# Patient Record
Sex: Female | Born: 1985 | Race: White | Hispanic: No | Smoking: Never smoker
Health system: Southern US, Community
[De-identification: ages and names within clinical notes are randomized; demographics above are authoritative.]

## PROBLEM LIST (undated history)

## (undated) HISTORY — PX: TUBAL LIGATION: SHX77

---

## 2020-04-13 ENCOUNTER — Emergency Department (HOSPITAL_COMMUNITY): Payer: No Typology Code available for payment source

## 2020-04-13 ENCOUNTER — Other Ambulatory Visit: Payer: Self-pay

## 2020-04-13 ENCOUNTER — Inpatient Hospital Stay (HOSPITAL_COMMUNITY)
Admission: EM | Admit: 2020-04-13 | Discharge: 2020-04-15 | DRG: 419 | Disposition: A | Discharge status: Home / Self Care | Payer: No Typology Code available for payment source | Attending: Family Medicine | Admitting: Family Medicine

## 2020-04-13 ENCOUNTER — Encounter (HOSPITAL_COMMUNITY): Payer: Self-pay | Admitting: Obstetrics and Gynecology

## 2020-04-13 DIAGNOSIS — Z800 Family history of malignant neoplasm of digestive organs: Secondary | ICD-10-CM

## 2020-04-13 DIAGNOSIS — R1013 Epigastric pain: Secondary | ICD-10-CM | POA: Diagnosis not present

## 2020-04-13 DIAGNOSIS — R112 Nausea with vomiting, unspecified: Secondary | ICD-10-CM

## 2020-04-13 DIAGNOSIS — K5900 Constipation, unspecified: Secondary | ICD-10-CM | POA: Diagnosis present

## 2020-04-13 DIAGNOSIS — K810 Acute cholecystitis: Secondary | ICD-10-CM

## 2020-04-13 DIAGNOSIS — Z20822 Contact with and (suspected) exposure to covid-19: Secondary | ICD-10-CM | POA: Diagnosis present

## 2020-04-13 DIAGNOSIS — K8001 Calculus of gallbladder with acute cholecystitis with obstruction: Secondary | ICD-10-CM | POA: Diagnosis not present

## 2020-04-13 DIAGNOSIS — K760 Fatty (change of) liver, not elsewhere classified: Secondary | ICD-10-CM | POA: Diagnosis present

## 2020-04-13 LAB — COMPREHENSIVE METABOLIC PANEL
ALT: 30 U/L (ref 0–44)
AST: 25 U/L (ref 15–41)
Albumin: 4.7 g/dL (ref 3.5–5.0)
Alkaline Phosphatase: 52 U/L (ref 38–126)
Anion gap: 10 (ref 5–15)
BUN: 10 mg/dL (ref 6–20)
CO2: 23 mmol/L (ref 22–32)
Calcium: 9.3 mg/dL (ref 8.9–10.3)
Chloride: 100 mmol/L (ref 98–111)
Creatinine, Ser: 0.77 mg/dL (ref 0.44–1.00)
GFR, Estimated: >60 mL/min (ref >60)
Glucose, Bld: 104 mg/dL — ABNORMAL HIGH (ref 70–99)
Potassium: 4.1 mmol/L (ref 3.5–5.1)
Sodium: 133 mmol/L — ABNORMAL LOW (ref 135–145)
Total Bilirubin: 0.7 mg/dL (ref 0.3–1.2)
Total Protein: 8.7 g/dL — ABNORMAL HIGH (ref 6.5–8.1)

## 2020-04-13 LAB — LIPASE, BLOOD: Lipase: 28 U/L (ref 11–51)

## 2020-04-13 LAB — CBC
HCT: 42.3 % (ref 36.0–46.0)
Hemoglobin: 13.9 g/dL (ref 12.0–15.0)
MCH: 29.1 pg (ref 26.0–34.0)
MCHC: 32.9 g/dL (ref 30.0–36.0)
MCV: 88.5 fL (ref 80.0–100.0)
Platelets: 314 10*3/uL (ref 150–400)
RBC: 4.78 MIL/uL (ref 3.87–5.11)
RDW: 14.1 % (ref 11.5–15.5)
WBC: 12 10*3/uL — ABNORMAL HIGH (ref 4.0–10.5)
nRBC: 0 % (ref 0.0–0.2)

## 2020-04-13 LAB — URINALYSIS, ROUTINE W REFLEX MICROSCOPIC
Bacteria, UA: NONE SEEN
Bilirubin Urine: NEGATIVE
Glucose, UA: NEGATIVE mg/dL
Ketones, ur: 5 mg/dL — AB
Leukocytes,Ua: NEGATIVE
Nitrite: NEGATIVE
Protein, ur: NEGATIVE mg/dL
Specific Gravity, Urine: 1.006 (ref 1.005–1.030)
pH: 6 (ref 5.0–8.0)

## 2020-04-13 LAB — I-STAT BETA HCG BLOOD, ED (MC, WL, AP ONLY): I-stat hCG, quantitative: <5 m[IU]/mL (ref <5)

## 2020-04-13 LAB — LACTIC ACID, PLASMA: Lactic Acid, Venous: 0.8 mmol/L (ref 0.5–1.9)

## 2020-04-13 MED ORDER — MORPHINE SULFATE (PF) 4 MG/ML IV SOLN
4.0000 mg | Freq: Once | INTRAVENOUS | Status: AC
Start: 2020-04-13 — End: 2020-04-13
  Administered 2020-04-13: 4 mg via INTRAVENOUS
  Filled 2020-04-13: qty 1

## 2020-04-13 MED ORDER — ACETAMINOPHEN 325 MG PO TABS
650.0000 mg | ORAL_TABLET | Freq: Four times a day (QID) | ORAL | Status: DC | PRN
  Administered 2020-04-14: 650 mg via ORAL
  Filled 2020-04-13: qty 2

## 2020-04-13 MED ORDER — SODIUM CHLORIDE 0.9 % IV BOLUS
1000.0000 mL | Freq: Once | INTRAVENOUS | Status: AC
  Administered 2020-04-13: 1000 mL via INTRAVENOUS

## 2020-04-13 MED ORDER — OXYCODONE HCL 5 MG PO TABS: 5.0000 mg | ORAL_TABLET | ORAL | Status: DC | PRN

## 2020-04-13 MED ORDER — SODIUM CHLORIDE 0.9 % IV SOLN
2.0000 g | Freq: Once | INTRAVENOUS | Status: AC
  Administered 2020-04-13: 2 g via INTRAVENOUS
  Filled 2020-04-13: qty 20

## 2020-04-13 MED ORDER — PANTOPRAZOLE SODIUM 40 MG IV SOLR
40.0000 mg | Freq: Two times a day (BID) | INTRAVENOUS | Status: DC
  Administered 2020-04-13 – 2020-04-14 (×3): 40 mg via INTRAVENOUS
  Filled 2020-04-13 (×3): qty 40

## 2020-04-13 MED ORDER — PROMETHAZINE HCL 25 MG/ML IJ SOLN
12.5000 mg | Freq: Once | INTRAMUSCULAR | Status: AC
  Administered 2020-04-13: 12.5 mg via INTRAVENOUS
  Filled 2020-04-13: qty 1

## 2020-04-13 MED ORDER — SODIUM CHLORIDE 0.9% FLUSH
3.0000 mL | Freq: Two times a day (BID) | INTRAVENOUS | Status: DC
  Administered 2020-04-13 – 2020-04-15 (×3): 3 mL via INTRAVENOUS

## 2020-04-13 MED ORDER — HYDROMORPHONE HCL 1 MG/ML IJ SOLN
0.5000 mg | INTRAMUSCULAR | Status: DC | PRN
  Administered 2020-04-13 – 2020-04-15 (×6): 1 mg via INTRAVENOUS
  Filled 2020-04-13 (×7): qty 1

## 2020-04-13 MED ORDER — ONDANSETRON HCL 4 MG/2ML IJ SOLN
4.0000 mg | Freq: Four times a day (QID) | INTRAMUSCULAR | Status: DC | PRN
  Administered 2020-04-14 (×2): 4 mg via INTRAVENOUS
  Filled 2020-04-13 (×2): qty 2

## 2020-04-13 MED ORDER — LIDOCAINE VISCOUS HCL 2 % MT SOLN
15.0000 mL | Freq: Once | OROMUCOSAL | Status: AC
  Administered 2020-04-13: 15 mL via ORAL
  Filled 2020-04-13: qty 15

## 2020-04-13 MED ORDER — ACETAMINOPHEN 650 MG RE SUPP: 650.0000 mg | Freq: Four times a day (QID) | RECTAL | Status: DC | PRN

## 2020-04-13 MED ORDER — OXYCODONE-ACETAMINOPHEN 5-325 MG PO TABS
1.0000 | ORAL_TABLET | Freq: Once | ORAL | Status: AC
  Administered 2020-04-13: 1 via ORAL
  Filled 2020-04-13: qty 1

## 2020-04-13 MED ORDER — ONDANSETRON HCL 4 MG/2ML IJ SOLN
4.0000 mg | Freq: Once | INTRAMUSCULAR | Status: AC
  Administered 2020-04-13: 4 mg via INTRAVENOUS
  Filled 2020-04-13: qty 2

## 2020-04-13 MED ORDER — SODIUM CHLORIDE 0.9 % IV SOLN: INTRAVENOUS | Status: DC

## 2020-04-13 MED ORDER — SODIUM CHLORIDE 0.9 % IV SOLN
2.0000 g | INTRAVENOUS | Status: DC
  Administered 2020-04-14: 09:00:00 2 g via INTRAVENOUS
  Filled 2020-04-13: qty 2

## 2020-04-13 MED ORDER — IOHEXOL 300 MG/ML  SOLN
100.0000 mL | Freq: Once | INTRAMUSCULAR | Status: AC | PRN
  Administered 2020-04-13: 100 mL via INTRAVENOUS

## 2020-04-13 MED ORDER — ALUM & MAG HYDROXIDE-SIMETH 200-200-20 MG/5ML PO SUSP
30.0000 mL | Freq: Once | ORAL | Status: AC
  Administered 2020-04-13: 30 mL via ORAL
  Filled 2020-04-13: qty 30

## 2020-04-13 MED ORDER — ONDANSETRON HCL 4 MG PO TABS: 4.0000 mg | ORAL_TABLET | Freq: Four times a day (QID) | ORAL | Status: DC | PRN

## 2020-04-13 NOTE — ED Triage Notes (Signed)
Patient reports to the ER for abdominal pain. Patient reports she has had episodes like this before but has treated them at home with warm baths. Patient reports the pain is periumbilical that radiates down.
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
## 2020-04-13 NOTE — ED Notes (Addendum)
Patient tolerated PO challenge without vomiting but still reports pain at this time.
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
## 2020-04-14 ENCOUNTER — Encounter (HOSPITAL_COMMUNITY): Admission: EM | Disposition: A | Discharge status: Home / Self Care | Payer: Self-pay | Source: Home / Self Care

## 2020-04-14 ENCOUNTER — Observation Stay (HOSPITAL_COMMUNITY): Payer: No Typology Code available for payment source | Admitting: Anesthesiology

## 2020-04-14 ENCOUNTER — Observation Stay (HOSPITAL_COMMUNITY): Payer: No Typology Code available for payment source

## 2020-04-14 ENCOUNTER — Encounter (HOSPITAL_COMMUNITY): Payer: Self-pay | Admitting: Family Medicine

## 2020-04-14 DIAGNOSIS — K810 Acute cholecystitis: Secondary | ICD-10-CM | POA: Diagnosis not present

## 2020-04-14 DIAGNOSIS — K5900 Constipation, unspecified: Secondary | ICD-10-CM | POA: Diagnosis present

## 2020-04-14 DIAGNOSIS — K760 Fatty (change of) liver, not elsewhere classified: Secondary | ICD-10-CM | POA: Diagnosis present

## 2020-04-14 DIAGNOSIS — Z800 Family history of malignant neoplasm of digestive organs: Secondary | ICD-10-CM | POA: Diagnosis not present

## 2020-04-14 DIAGNOSIS — K8001 Calculus of gallbladder with acute cholecystitis with obstruction: Secondary | ICD-10-CM | POA: Diagnosis present

## 2020-04-14 DIAGNOSIS — R1013 Epigastric pain: Secondary | ICD-10-CM | POA: Diagnosis present

## 2020-04-14 DIAGNOSIS — Z20822 Contact with and (suspected) exposure to covid-19: Secondary | ICD-10-CM | POA: Diagnosis present

## 2020-04-14 HISTORY — PX: CHOLECYSTECTOMY: SHX55

## 2020-04-14 LAB — COMPREHENSIVE METABOLIC PANEL
ALT: 24 U/L (ref 0–44)
AST: 17 U/L (ref 15–41)
Albumin: 3.7 g/dL (ref 3.5–5.0)
Alkaline Phosphatase: 49 U/L (ref 38–126)
Anion gap: 11 (ref 5–15)
BUN: 8 mg/dL (ref 6–20)
CO2: 20 mmol/L — ABNORMAL LOW (ref 22–32)
Calcium: 8.2 mg/dL — ABNORMAL LOW (ref 8.9–10.3)
Chloride: 105 mmol/L (ref 98–111)
Creatinine, Ser: 0.59 mg/dL (ref 0.44–1.00)
GFR, Estimated: >60 mL/min (ref >60)
Glucose, Bld: 102 mg/dL — ABNORMAL HIGH (ref 70–99)
Potassium: 3.8 mmol/L (ref 3.5–5.1)
Sodium: 136 mmol/L (ref 135–145)
Total Bilirubin: 1 mg/dL (ref 0.3–1.2)
Total Protein: 7 g/dL (ref 6.5–8.1)

## 2020-04-14 LAB — LIPASE, BLOOD: Lipase: 27 U/L (ref 11–51)

## 2020-04-14 LAB — CBC
HCT: 39.1 % (ref 36.0–46.0)
Hemoglobin: 12.6 g/dL (ref 12.0–15.0)
MCH: 28.8 pg (ref 26.0–34.0)
MCHC: 32.2 g/dL (ref 30.0–36.0)
MCV: 89.5 fL (ref 80.0–100.0)
Platelets: 223 10*3/uL (ref 150–400)
RBC: 4.37 MIL/uL (ref 3.87–5.11)
RDW: 14.2 % (ref 11.5–15.5)
WBC: 11.3 10*3/uL — ABNORMAL HIGH (ref 4.0–10.5)
nRBC: 0 % (ref 0.0–0.2)

## 2020-04-14 LAB — SARS CORONAVIRUS 2 (TAT 6-24 HRS): SARS Coronavirus 2: NEGATIVE

## 2020-04-14 LAB — HIV ANTIBODY (ROUTINE TESTING W REFLEX): HIV Screen 4th Generation wRfx: NONREACTIVE

## 2020-04-14 SURGERY — LAPAROSCOPIC CHOLECYSTECTOMY WITH INTRAOPERATIVE CHOLANGIOGRAM
Anesthesia: General

## 2020-04-14 MED ORDER — IBUPROFEN 400 MG PO TABS: 600.0000 mg | ORAL_TABLET | Freq: Four times a day (QID) | ORAL | Status: DC | PRN

## 2020-04-14 MED ORDER — DEXAMETHASONE SODIUM PHOSPHATE 10 MG/ML IJ SOLN
INTRAMUSCULAR | Status: AC
  Filled 2020-04-14: qty 2

## 2020-04-14 MED ORDER — PROPOFOL 10 MG/ML IV BOLUS
INTRAVENOUS | Status: DC | PRN
  Administered 2020-04-14: 160 mg via INTRAVENOUS

## 2020-04-14 MED ORDER — MORPHINE SULFATE (PF) 4 MG/ML IV SOLN
3.0000 mg | Freq: Once | INTRAVENOUS | Status: DC
  Filled 2020-04-14: qty 1

## 2020-04-14 MED ORDER — LACTATED RINGERS IV SOLN: INTRAVENOUS | Status: DC

## 2020-04-14 MED ORDER — MIDAZOLAM HCL 2 MG/2ML IJ SOLN
INTRAMUSCULAR | Status: AC
  Filled 2020-04-14: qty 2

## 2020-04-14 MED ORDER — MIDAZOLAM HCL 5 MG/5ML IJ SOLN
INTRAMUSCULAR | Status: DC | PRN
  Administered 2020-04-14: 2 mg via INTRAVENOUS

## 2020-04-14 MED ORDER — TRAMADOL HCL 50 MG PO TABS
50.0000 mg | ORAL_TABLET | Freq: Four times a day (QID) | ORAL | Status: DC | PRN
  Administered 2020-04-14: 50 mg via ORAL
  Filled 2020-04-14: qty 1

## 2020-04-14 MED ORDER — SUGAMMADEX SODIUM 200 MG/2ML IV SOLN
INTRAVENOUS | Status: DC | PRN
  Administered 2020-04-14: 200 mg via INTRAVENOUS

## 2020-04-14 MED ORDER — 0.9 % SODIUM CHLORIDE (POUR BTL) OPTIME
TOPICAL | Status: DC | PRN
  Administered 2020-04-14: 1000 mL

## 2020-04-14 MED ORDER — DIPHENHYDRAMINE HCL 50 MG/ML IJ SOLN
INTRAMUSCULAR | Status: DC | PRN
  Administered 2020-04-14: 12.5 mg via INTRAVENOUS

## 2020-04-14 MED ORDER — LIDOCAINE 2% (20 MG/ML) 5 ML SYRINGE
INTRAMUSCULAR | Status: DC | PRN
  Administered 2020-04-14: 100 mg via INTRAVENOUS

## 2020-04-14 MED ORDER — DEXAMETHASONE SODIUM PHOSPHATE 10 MG/ML IJ SOLN
INTRAMUSCULAR | Status: DC | PRN
  Administered 2020-04-14: 10 mg via INTRAVENOUS

## 2020-04-14 MED ORDER — MEPERIDINE HCL 50 MG/ML IJ SOLN: 6.2500 mg | INTRAMUSCULAR | Status: DC | PRN

## 2020-04-14 MED ORDER — ONDANSETRON HCL 4 MG/2ML IJ SOLN
INTRAMUSCULAR | Status: DC | PRN
  Administered 2020-04-14: 4 mg via INTRAVENOUS

## 2020-04-14 MED ORDER — ONDANSETRON HCL 4 MG/2ML IJ SOLN
INTRAMUSCULAR | Status: AC
  Filled 2020-04-14: qty 4

## 2020-04-14 MED ORDER — HYDROMORPHONE HCL 1 MG/ML IJ SOLN
0.2500 mg | INTRAMUSCULAR | Status: DC | PRN
Start: 2020-04-14 — End: 2020-04-14
  Administered 2020-04-14: 0.5 mg via INTRAVENOUS

## 2020-04-14 MED ORDER — DEXAMETHASONE SODIUM PHOSPHATE 10 MG/ML IJ SOLN
INTRAMUSCULAR | Status: AC
  Filled 2020-04-14: qty 1

## 2020-04-14 MED ORDER — LACTATED RINGERS IV SOLN
INTRAVENOUS | Status: AC | PRN
  Administered 2020-04-14: 1000 mL

## 2020-04-14 MED ORDER — FENTANYL CITRATE (PF) 250 MCG/5ML IJ SOLN
INTRAMUSCULAR | Status: AC
  Filled 2020-04-14: qty 5

## 2020-04-14 MED ORDER — AMISULPRIDE (ANTIEMETIC) 5 MG/2ML IV SOLN: 10.0000 mg | Freq: Once | INTRAVENOUS | Status: DC | PRN

## 2020-04-14 MED ORDER — TECHNETIUM TC 99M MEBROFENIN IV KIT
4.8000 | PACK | Freq: Once | INTRAVENOUS | Status: AC
  Administered 2020-04-14: 11:00:00 4.8 via INTRAVENOUS

## 2020-04-14 MED ORDER — SUCCINYLCHOLINE CHLORIDE 200 MG/10ML IV SOSY
PREFILLED_SYRINGE | INTRAVENOUS | Status: DC | PRN
  Administered 2020-04-14: 120 mg via INTRAVENOUS

## 2020-04-14 MED ORDER — ROCURONIUM BROMIDE 10 MG/ML (PF) SYRINGE
PREFILLED_SYRINGE | INTRAVENOUS | Status: DC | PRN
  Administered 2020-04-14: 10 mg via INTRAVENOUS
  Administered 2020-04-14: 50 mg via INTRAVENOUS

## 2020-04-14 MED ORDER — GLYCOPYRROLATE PF 0.2 MG/ML IJ SOSY
PREFILLED_SYRINGE | INTRAMUSCULAR | Status: AC
  Filled 2020-04-14: qty 1

## 2020-04-14 MED ORDER — ROCURONIUM BROMIDE 10 MG/ML (PF) SYRINGE
PREFILLED_SYRINGE | INTRAVENOUS | Status: AC
  Filled 2020-04-14: qty 10

## 2020-04-14 MED ORDER — FENTANYL CITRATE (PF) 100 MCG/2ML IJ SOLN
INTRAMUSCULAR | Status: DC | PRN
  Administered 2020-04-14: 100 ug via INTRAVENOUS
  Administered 2020-04-14 (×2): 25 ug via INTRAVENOUS
  Administered 2020-04-14: 100 ug via INTRAVENOUS

## 2020-04-14 MED ORDER — PROPOFOL 10 MG/ML IV BOLUS
INTRAVENOUS | Status: AC
  Filled 2020-04-14: qty 20

## 2020-04-14 MED ORDER — SUCCINYLCHOLINE CHLORIDE 200 MG/10ML IV SOSY
PREFILLED_SYRINGE | INTRAVENOUS | Status: AC
  Filled 2020-04-14: qty 10

## 2020-04-14 MED ORDER — HYDROMORPHONE HCL 1 MG/ML IJ SOLN
INTRAMUSCULAR | Status: AC
  Administered 2020-04-14: 0.5 mg via INTRAVENOUS
  Filled 2020-04-14: qty 1

## 2020-04-14 MED ORDER — BUPIVACAINE-EPINEPHRINE (PF) 0.25% -1:200000 IJ SOLN
INTRAMUSCULAR | Status: AC
  Filled 2020-04-14: qty 30

## 2020-04-14 MED ORDER — DIPHENHYDRAMINE HCL 50 MG/ML IJ SOLN
INTRAMUSCULAR | Status: AC
  Filled 2020-04-14: qty 1

## 2020-04-14 MED ORDER — LIDOCAINE HCL (PF) 2 % IJ SOLN
INTRAMUSCULAR | Status: AC
  Filled 2020-04-14: qty 5

## 2020-04-14 MED ORDER — CHLORHEXIDINE GLUCONATE 0.12 % MT SOLN
15.0000 mL | Freq: Once | OROMUCOSAL | Status: AC
  Administered 2020-04-14: 15 mL via OROMUCOSAL

## 2020-04-14 MED ORDER — BUPIVACAINE-EPINEPHRINE 0.25% -1:200000 IJ SOLN
INTRAMUSCULAR | Status: DC | PRN
  Administered 2020-04-14: 30 mL

## 2020-04-14 SURGICAL SUPPLY — 41 items
APPLICATOR ARISTA FLEXITIP XL (MISCELLANEOUS) IMPLANT
APPLIER CLIP 5 13 M/L LIGAMAX5 (MISCELLANEOUS) ×2
APPLIER CLIP ROT 10 11.4 M/L (STAPLE)
CABLE HIGH FREQUENCY MONO STRZ (ELECTRODE) ×2 IMPLANT
CHLORAPREP W/TINT 26 (MISCELLANEOUS) ×2 IMPLANT
CLIP APPLIE 5 13 M/L LIGAMAX5 (MISCELLANEOUS) ×1 IMPLANT
CLIP APPLIE ROT 10 11.4 M/L (STAPLE) IMPLANT
COVER MAYO STAND STRL (DRAPES) IMPLANT
COVER SURGICAL LIGHT HANDLE (MISCELLANEOUS) ×2 IMPLANT
COVER WAND RF STERILE (DRAPES) IMPLANT
DECANTER SPIKE VIAL GLASS SM (MISCELLANEOUS) ×2 IMPLANT
DERMABOND ADVANCED (GAUZE/BANDAGES/DRESSINGS) ×1
DERMABOND ADVANCED .7 DNX12 (GAUZE/BANDAGES/DRESSINGS) ×1 IMPLANT
DISSECTOR BLUNT TIP ENDO 5MM (MISCELLANEOUS) ×2 IMPLANT
DRAPE C-ARM 42X120 X-RAY (DRAPES) IMPLANT
ELECT PENCIL ROCKER SW 15FT (MISCELLANEOUS) ×2 IMPLANT
ELECT REM PT RETURN 15FT ADLT (MISCELLANEOUS) ×2 IMPLANT
GLOVE INDICATOR 8.0 STRL GRN (GLOVE) ×2 IMPLANT
GLOVE SURG ENC MOIS LTX SZ7.5 (GLOVE) ×2 IMPLANT
GOWN STRL REUS W/TWL XL LVL3 (GOWN DISPOSABLE) ×4 IMPLANT
GRASPER SUT TROCAR 14GX15 (MISCELLANEOUS) IMPLANT
HEMOSTAT ARISTA ABSORB 3G PWDR (HEMOSTASIS) IMPLANT
HEMOSTAT SNOW SURGICEL 2X4 (HEMOSTASIS) IMPLANT
HEMOSTAT SURGICEL 4X8 (HEMOSTASIS) ×2 IMPLANT
KIT BASIN OR (CUSTOM PROCEDURE TRAY) ×2 IMPLANT
NEEDLE INSUFFLATION 14GA 120MM (NEEDLE) IMPLANT
POUCH SPECIMEN RETRIEVAL 10MM (ENDOMECHANICALS) ×2 IMPLANT
SCISSORS LAP 5X35 DISP (ENDOMECHANICALS) ×2 IMPLANT
SET CHOLANGIOGRAPH MIX (MISCELLANEOUS) IMPLANT
SET IRRIG TUBING LAPAROSCOPIC (IRRIGATION / IRRIGATOR) ×2 IMPLANT
SET TUBE SMOKE EVAC HIGH FLOW (TUBING) ×2 IMPLANT
SLEEVE ADV FIXATION 5X100MM (TROCAR) ×6 IMPLANT
SUT MNCRL AB 4-0 PS2 18 (SUTURE) ×2 IMPLANT
SYR 10ML ECCENTRIC (SYRINGE) ×2 IMPLANT
TOWEL OR 17X26 10 PK STRL BLUE (TOWEL DISPOSABLE) ×2 IMPLANT
TOWEL OR NON WOVEN STRL DISP B (DISPOSABLE) IMPLANT
TRAY LAPAROSCOPIC (CUSTOM PROCEDURE TRAY) ×2 IMPLANT
TROCAR ADV FIXATION 12X100MM (TROCAR) IMPLANT
TROCAR ADV FIXATION 5X100MM (TROCAR) ×2 IMPLANT
TROCAR XCEL BLUNT TIP 100MML (ENDOMECHANICALS) ×2 IMPLANT
TROCAR XCEL NON-BLD 11X100MML (ENDOMECHANICALS) IMPLANT

## 2020-04-14 NOTE — Consult Note (Signed)
Sydney Macias October 31, 1985  [id number].    Requesting MD: Dr. Brendia Sacks Chief Complaint/Reason for Consult: epigastric abdominal pain  HPI:  This is a 35 yo otherwise healthy female who is here from [place] visiting her mother.  She states for the last 5 months she has been having intermittent episodes of epigastric abdominal pain at night that would generally go away by the morning.  She saw an urgent care about this in [place] and she was told it was peptic ulcers.  She was given famotidine, but doesn't feel like this helped.  This happens every 2-3 weeks.    She ate an omelet on Saturday morning, but did not have pain following this.  She did not eat for the rest of the day as she had no appetite.  Her pain began on Sunday in her epigastrium and she states it is sharp and radiates to the back.  It goes to the left and right upper abdomen.  She admits to N/V, but no diarrhea or fevers.  She came to the [facility] where she had an [place] that revealed no gallstones or wall thickening c/w cholecystitis.  Her WBC was minimally elevated around 12K.  Her LFTs were normal.  She then underwent a CT scan that questioned maybe some fluid around her gallbladder but no other concerning findings for cholecystitis.  We have been asked to see her for further recommendations.  ROS: ROS: Please see HPI, otherwise all other systems have been reviewed and are negative.  Family History  Problem Relation Age of Onset  . Stomach cancer Maternal Grandmother     History reviewed. No pertinent past medical history.  Past Surgical History:  Procedure Laterality Date  . TUBAL LIGATION      Social History:  reports that she has never smoked. She has never used smokeless tobacco. She reports current alcohol use of about 1.0 standard drink of alcohol per week. She reports previous drug use.  Allergies: No Known Allergies  Medications Prior to Admission  Medication Sig Dispense Refill  . famotidine (PEPCID) 40 MG  tablet Take 40 mg by mouth daily.    . ondansetron (ZOFRAN-ODT) 4 MG disintegrating tablet Take 4 mg by mouth 3 (three) times daily as needed for nausea/vomiting.       Physical Exam: Blood pressure 127/78, pulse 86, temperature 98.6 F (37 C), temperature source Oral, resp. rate 18, SpO2 97 %. General: pleasant, WD, WN, mildly obese white female who is laying in bed in mild distress secondary to pain HEENT: head is normocephalic, atraumatic.  Sclera are noninjected.  PERRL.  Ears and nose without any masses or lesions.  Mouth is pink and moist Heart: regular, rate, and rhythm.  Normal s1,s2. No obvious murmurs, gallops, or rubs noted.  Palpable radial and pedal pulses bilaterally Lungs: CTAB, no wheezes, rhonchi, or rales noted.  Respiratory effort nonlabored Abd: soft, very tender in RUQ, + guarding, ND, +BS, no masses, hernias, or organomegaly MS: all 4 extremities are symmetrical with no cyanosis, clubbing, or edema. Skin: warm and dry with no masses, lesions, or rashes Neuro: Cranial nerves 2-12 grossly intact, sensation is normal throughout Psych: A&Ox3 with an appropriate affect.   Results for orders placed or performed during the hospital encounter of 04/13/20 (from the past 48 hour(s))  Lipase, blood     Status: None   Collection Time: 04/13/20  8:36 AM  Result Value Ref Range   Lipase 28 11 - 51 U/L  Comment: Performed at [facility], [address] [place], [place] [address]  Comprehensive metabolic panel     Status: Abnormal   Collection Time: 04/13/20  8:36 AM  Result Value Ref Range   Sodium 133 (L) 135 - 145 mmol/L   Potassium 4.1 3.5 - 5.1 mmol/L   Chloride 100 98 - 111 mmol/L   CO2 23 22 - 32 mmol/L   Glucose, Bld 104 (H) 70 - 99 mg/dL    Comment: Glucose reference range applies only to samples taken after fasting for at least 8 hours.   BUN 10 6 - 20 mg/dL   Creatinine, Ser 3.82 0.44 - 1.00 mg/dL   Calcium 9.3 8.9 - 50.5 mg/dL   Total  Protein 8.7 (H) 6.5 - 8.1 g/dL   Albumin 4.7 3.5 - 5.0 g/dL   AST 25 15 - 41 U/L   ALT 30 0 - 44 U/L   Alkaline Phosphatase 52 38 - 126 U/L   Total Bilirubin 0.7 0.3 - 1.2 mg/dL   GFR, Estimated >39 >76 mL/min    Comment: (NOTE) Calculated using the CKD-EPI Creatinine Equation (2021)    Anion gap 10 5 - 15    Comment: Performed at [facility], [address], [place], [place] [address]  CBC     Status: Abnormal   Collection Time: 04/13/20  8:36 AM  Result Value Ref Range   WBC 12.0 (H) 4.0 - 10.5 K/uL   RBC 4.78 3.87 - 5.11 MIL/uL   Hemoglobin 13.9 12.0 - 15.0 g/dL   HCT 37.9 02.4 - 09.7 %   MCV 88.5 80.0 - 100.0 fL   MCH 29.1 26.0 - 34.0 pg   MCHC 32.9 30.0 - 36.0 g/dL   RDW 35.3 29.9 - 24.2 %   Platelets 314 150 - 400 K/uL   nRBC 0.0 0.0 - 0.2 %    Comment: Performed at [facility], [address], [place], [place] [address]  I-Stat beta hCG blood, ED     Status: None   Collection Time: 04/13/20  8:43 AM  Result Value Ref Range   I-stat hCG, quantitative <5.0 <5 mIU/mL   Comment 3            Comment:   GEST. AGE      CONC.  (mIU/mL)   <=1 WEEK        5 - 50     2 WEEKS       50 - 500     3 WEEKS       100 - 10,000     4 WEEKS     1,000 - 30,000        FEMALE AND NON-PREGNANT FEMALE:     LESS THAN 5 mIU/mL   Urinalysis, Routine w reflex microscopic Urine, Clean Catch     Status: Abnormal   Collection Time: 04/13/20  1:20 PM  Result Value Ref Range   Color, Urine STRAW (A) YELLOW   APPearance CLEAR CLEAR   Specific Gravity, Urine 1.006 1.005 - 1.030   pH 6.0 5.0 - 8.0   Glucose, UA NEGATIVE NEGATIVE mg/dL   Hgb urine dipstick SMALL (A) NEGATIVE   Bilirubin Urine NEGATIVE NEGATIVE   Ketones, ur 5 (A) NEGATIVE mg/dL   Protein, ur NEGATIVE NEGATIVE mg/dL   Nitrite NEGATIVE NEGATIVE   Leukocytes,Ua NEGATIVE NEGATIVE   RBC / HPF 0-5 0 - 5 RBC/hpf   WBC, UA 0-5 0 - 5 WBC/hpf  Bacteria, UA NONE SEEN NONE SEEN   Squamous  Epithelial / LPF 0-5 0 - 5    Comment: Performed at [facility], [address], [place], [place] [address]  Lactic acid, plasma     Status: None   Collection Time: 04/13/20  1:30 PM  Result Value Ref Range   Lactic Acid, Venous 0.8 0.5 - 1.9 mmol/L    Comment: Performed at [facility], [address], [place], [place] [address]  SARS CORONAVIRUS 2 (TAT 6-24 HRS) Nasopharyngeal Nasopharyngeal Swab     Status: None   Collection Time: 04/13/20  5:03 PM   Specimen: Nasopharyngeal Swab  Result Value Ref Range   SARS Coronavirus 2 NEGATIVE NEGATIVE    Comment: (NOTE) SARS-CoV-2 target nucleic acids are NOT DETECTED.  The SARS-CoV-2 RNA is generally detectable in upper and lower respiratory specimens during the acute phase of infection. Negative results do not preclude SARS-CoV-2 infection, do not rule out co-infections with other pathogens, and should not be used as the sole basis for treatment or other patient management decisions. Negative results must be combined with clinical observations, patient history, and epidemiological information. The expected result is Negative.  Fact Sheet for Patients: HairSlick.no  Fact Sheet for Healthcare Providers: quierodirigir.com  This test is not yet approved or cleared by the [place] FDA and  has been authorized for detection and/or diagnosis of SARS-CoV-2 by FDA under an Emergency Use Authorization (EUA). This EUA will remain  in effect (meaning this test can be used) for the duration of the COVID-19 declaration under Se ction 564(b)(1) of the Act, 21 U.S.C. section 360bbb-3(b)(1), unless the authorization is terminated or revoked sooner.  Performed at [facility], [address], [place], [place] [address]   Lipase, blood     Status: None   Collection Time: 04/14/20  4:38 AM  Result Value Ref Range   Lipase 27 11 - 51 U/L     Comment: Performed at [facility], [address], [place], [place] [address]  Comprehensive metabolic panel     Status: Abnormal   Collection Time: 04/14/20  4:38 AM  Result Value Ref Range   Sodium 136 135 - 145 mmol/L   Potassium 3.8 3.5 - 5.1 mmol/L   Chloride 105 98 - 111 mmol/L   CO2 20 (L) 22 - 32 mmol/L   Glucose, Bld 102 (H) 70 - 99 mg/dL    Comment: Glucose reference range applies only to samples taken after fasting for at least 8 hours.   BUN 8 6 - 20 mg/dL   Creatinine, Ser 5.42 0.44 - 1.00 mg/dL   Calcium 8.2 (L) 8.9 - 10.3 mg/dL   Total Protein 7.0 6.5 - 8.1 g/dL   Albumin 3.7 3.5 - 5.0 g/dL   AST 17 15 - 41 U/L   ALT 24 0 - 44 U/L   Alkaline Phosphatase 49 38 - 126 U/L   Total Bilirubin 1.0 0.3 - 1.2 mg/dL   GFR, Estimated >70 >62 mL/min    Comment: (NOTE) Calculated using the CKD-EPI Creatinine Equation (2021)    Anion gap 11 5 - 15    Comment: Performed at [facility], [address], [place], [place] [address]  CBC     Status: Abnormal   Collection Time: 04/14/20  4:38 AM  Result Value Ref Range   WBC 11.3 (H) 4.0 - 10.5 K/uL   RBC 4.37 3.87 - 5.11 MIL/uL  Hemoglobin 12.6 12.0 - 15.0 g/dL   HCT 41.7 40.8 - 14.4 %   MCV 89.5 80.0 - 100.0 fL   MCH 28.8 26.0 - 34.0 pg   MCHC 32.2 30.0 - 36.0 g/dL   RDW 81.8 56.3 - 14.9 %   Platelets 223 150 - 400 K/uL   nRBC 0.0 0.0 - 0.2 %    Comment: Performed at [facility], [address], [place], [place] [address]   CT Abdomen Pelvis W Contrast  Result Date: 04/13/2020 CLINICAL DATA:  Periumbilical abdominal pain. EXAM: CT ABDOMEN AND PELVIS WITH CONTRAST TECHNIQUE: Multidetector CT imaging of the abdomen and pelvis was performed using the standard protocol following bolus administration of intravenous contrast. CONTRAST:  OMNIPAQUE IOHEXOL 300 MG/ML  SOLN COMPARISON:  None. FINDINGS: Lower chest: No acute abnormality. Hepatobiliary: Normal appearance of  the liver. Normal distention of the gallbladder. Small amount of pericholecystic fluid. Pancreas: Unremarkable. No pancreatic ductal dilatation or surrounding inflammatory changes. Spleen: Normal in size without focal abnormality. Adrenals/Urinary Tract: Adrenal glands are unremarkable. Kidneys are normal, without renal calculi, focal lesion, or hydronephrosis. Bladder is unremarkable. Stomach/Bowel: Stomach is within normal limits. Appendix appears normal. No evidence of bowel wall thickening, distention, or inflammatory changes. Vascular/Lymphatic: No significant vascular findings are present. No enlarged abdominal or pelvic lymph nodes. Reproductive: Normal appearance of the uterus. Sequela of prior tubal ligation. Other: No abdominal wall hernia or abnormality. No abdominopelvic ascites. Musculoskeletal: No acute or significant osseous findings. IMPRESSION: 1. Small amount of pericholecystic fluid. This may represent early acalculous cholecystitis. Please correlate clinically. 2. Otherwise no evidence of acute abnormalities within the abdomen or pelvis. Electronically Signed   By: Ted Mcalpine M.D.   On: 04/13/2020 15:02   US Abdomen Limited RUQ (LIVER/GB)  Result Date: 04/13/2020 CLINICAL DATA:  Epigastric pain. EXAM: ULTRASOUND ABDOMEN LIMITED RIGHT UPPER QUADRANT COMPARISON:  None. FINDINGS: Gallbladder: No gallstones or wall thickening visualized. No sonographic Murphy sign noted by sonographer. Common bile duct: Diameter: 5 mm Liver: No focal lesion identified. Mildly increased parenchymal echogenicity. Portal vein is patent on color Doppler imaging with normal direction of blood flow towards the liver. Other: None. IMPRESSION: 1. No evidence of cholelithiasis or acute cholecystitis. 2. Mildly increased echogenicity of the liver, which may be seen with fatty infiltration or other hepatocellular disease. Electronically Signed   By: Ted Mcalpine M.D.   On: 04/13/2020 13:15       Assessment/Plan Epigastric/RUQ abdominal pain The patient has had an [place] and CT scan that neither were terribly impressive for cholecystitis. His US shows no stones or wall thickening or fluid; however, the CT suggests a small amount of pericholecystic fluid.  Her WBC is minimally elevated but her LFTs are normal.  She is tender in her RUQ so will order a HIDA scan to further evaluate the gallbladder.  If this is negative, she will likely require an upper endoscopy to rule out PUD or some other GI pathology.  If this is also negative and her pain persists, then we can discuss cholecystectomy knowing it may or may not help her symptoms.   FEN - NPO/IVFs VTE - ok for chemical prophylaxis from our standpoint ID - Rocephin   Letha Cape, [facility] 04/14/2020, 8:26 AM Please see Amion for pager number during day hours 7:00am-4:30pm or 7:00am -11:30am on weekends
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
## 2020-04-14 NOTE — Progress Notes (Signed)
HIDA Scan completed and demonstrated a non-visualized gallbladder but with tracer uptake by the liver and subsequent excretion into the bowel; consistent with a cystic duct obstruction. All clinically correlates to cholecystitis. She continues to have ongoing RUQ pain, requiring IV analgesics to control. We have therefore rediscussed everything.  -The anatomy and physiology of the hepatobiliary system was reviewed with her today. The pathophysiology of gallbladder disease was discussed as well. -We have reviewed surgery - laparoscopic cholecystectomy; additionally other treatment modalities including antibiotics and drains. She is interested in pursuing surgery. -The planned procedure, material risks (including, but not limited to, pain, bleeding, infection, scarring, need for blood transfusion, damage to surrounding structures- blood vessels/nerves/viscus/organs, damage to bile duct, bile leak, need for additional procedures, hernia, worsening of pre-existing medical conditions, pancreatitis, pneumonia, heart attack, stroke, death) benefits and alternatives to surgery were discussed at length. We have also reviewed scenarios where a 'subtotal' cholecystectomy may be carried out. We noted a good probability that the procedure would help improve her symptoms. The patient's questions were answered to their satisfaction, they voiced understanding and they elected to proceed with surgery. Additionally, we discussed typical postoperative expectations and the recovery process.  Marin Olp, MD [facility] Use [email or web address] to contact on call provider

## 2020-04-14 NOTE — Transfer of Care (Signed)
Immediate Anesthesia Transfer of Care Note  Patient: Sydney Macias  Procedure(s) Performed: LAPAROSCOPIC CHOLECYSTECTOMY (N/A )  Patient Location: PACU  Anesthesia Type:General  Level of Consciousness: drowsy  Airway & Oxygen Therapy: Patient Spontanous Breathing and Patient connected to face mask oxygen  Post-op Assessment: Report given to RN and Post -op Vital signs reviewed and stable  Post vital signs: Reviewed and stable  Last Vitals:  Vitals Value Taken Time  BP 133/81 04/14/20 1640  Temp    Pulse 97 04/14/20 1642  Resp 18 04/14/20 1642  SpO2 100 % 04/14/20 1642  Vitals shown include unvalidated device data.  Last Pain:  Vitals:   04/14/20 1428  TempSrc:   PainSc: 5       Patients Stated Pain Goal: 3 (04/14/20 1428)  Complications: No complications documented.

## 2020-04-14 NOTE — Hospital Course (Addendum)
35 year old woman no significant PMH presented with acute on chronic abdominal pain, admitted for further evaluation with differential including peptic ulcer disease and biliary colic/cholecystitis.  CT of the abdomen pelvis and right upper quadrant ultrasound unremarkable.  LFTs and lipase within normal limits.  Treated with empiric antibiotics.  Seen by general surgery and GI.  HIDA scan compatible with cystic duct obstruction.  Continue empiric antibiotics, follow-up surgical recommendations.  A & P  Severe epigastric and right upper quadrant pain in the context of Naprosyn use, made worse by eating.  LFTs and abdominal imaging unremarkable --HIDA scan compatible with cystic duct obstruction consistent with cholecystitis, now suspected.  Continue empiric antibiotics.  Follow-up surgery recommendations. --Continue pain control.  Remain n.p.o. for now.

## 2020-04-14 NOTE — Discharge Instructions (Signed)
[facility]  Please arrive at least 30 min before your appointment to complete your check in paperwork.  If you are unable to arrive 30 min prior to your appointment time we may have to cancel or reschedule you. LAPAROSCOPIC SURGERY: POST OP INSTRUCTIONS Always review your discharge instruction sheet given to you by the facility where your surgery was performed. IF YOU HAVE DISABILITY OR FAMILY LEAVE FORMS, YOU MUST BRING THEM TO THE OFFICE FOR PROCESSING.   DO NOT GIVE THEM TO YOUR DOCTOR.  PAIN CONTROL  1. First take acetaminophen (Tylenol) AND/or ibuprofen (Advil) to control your pain after surgery.  Follow directions on package.  Taking acetaminophen (Tylenol) and/or ibuprofen (Advil) regularly after surgery will help to control your pain and lower the amount of prescription pain medication you may need.  You should not take more than 4,000 mg (4 grams) of acetaminophen (Tylenol) in 24 hours.  You should not take ibuprofen (Advil), aleve, motrin, naprosyn or other NSAIDS if you have a history of stomach ulcers or chronic kidney disease.  2. A prescription for pain medication may be given to you upon discharge.  Take your pain medication as prescribed, if you still have uncontrolled pain after taking acetaminophen (Tylenol) or ibuprofen (Advil). 3. Use ice packs to help control pain. 4. If you need a refill on your pain medication, please contact your pharmacy.  They will contact our office to request authorization. Prescriptions will not be filled after 5pm or on week-ends.  HOME MEDICATIONS 5. Take your usually prescribed medications unless otherwise directed.  DIET 6. You should follow a light diet the first few days after arrival home.  Be sure to include lots of fluids daily. Avoid fatty, fried foods.   CONSTIPATION 7. It is common to experience some constipation after surgery and if you are taking pain medication.  Increasing fluid intake and taking a stool  softener (such as Colace) will usually help or prevent this problem from occurring.  A mild laxative (Milk of Magnesia or Miralax) should be taken according to package instructions if there are no bowel movements after 48 hours.  WOUND/INCISION CARE 8. Most patients will experience some swelling and bruising in the area of the incisions.  Ice packs will help.  Swelling and bruising can take several days to resolve.  9. Unless discharge instructions indicate otherwise, follow guidelines below  a. STERI-STRIPS - you may remove your outer bandages 48 hours after surgery, and you may shower at that time.  You have steri-strips (small skin tapes) in place directly over the incision.  These strips should be left on the skin for 7-10 days.   b. DERMABOND/SKIN GLUE - you may shower in 24 hours.  The glue will flake off over the next 2-3 weeks. 10. Any sutures or staples will be removed at the office during your follow-up visit.  ACTIVITIES 11. You may resume regular (light) daily activities beginning the next day--such as daily self-care, walking, climbing stairs--gradually increasing activities as tolerated.  You may have sexual intercourse when it is comfortable.  Refrain from any heavy lifting or straining until approved by your doctor. a. You may drive when you are no longer taking prescription pain medication, you can comfortably wear a seatbelt, and you can safely maneuver your car and apply brakes.  FOLLOW-UP 12. You should see your doctor in the office for a follow-up appointment approximately 2-3 weeks after your surgery.  You should have been given your post-op/follow-up appointment when   your surgery was scheduled.  If you did not receive a post-op/follow-up appointment, make sure that you call for this appointment within a day or two after you arrive home to insure a convenient appointment time.   WHEN TO CALL YOUR DOCTOR: 1. Fever over 101.0 2. Inability to urinate 3. Continued bleeding from  incision. 4. Increased pain, redness, or drainage from the incision. 5. Increasing abdominal pain  The clinic staff is available to answer your questions during regular business hours.  Please don't hesitate to call and ask to speak to one of the nurses for clinical concerns.  If you have a medical emergency, go to the nearest emergency room or call 911.  A surgeon from [facility] is always on call at the hospital. [address], [address], [place], [place]  [address] ? [address] [address], [place], [place]   [address] [phone] ? [phone] ? FAX [phone]  .........   Managing Your Pain After Surgery Without Opioids    Thank you for participating in our program to help patients manage their pain after surgery without opioids. This is part of our effort to provide you with the best care possible, without exposing you or your family to the risk that opioids pose.  What pain can I expect after surgery? You can expect to have some pain after surgery. This is normal. The pain is typically worse the day after surgery, and quickly begins to get better. Many studies have found that many patients are able to manage their pain after surgery with Over-the-Counter (OTC) medications such as Tylenol and Motrin. If you have a condition that does not allow you to take Tylenol or Motrin, notify your surgical team.  How will I manage my pain? The best strategy for controlling your pain after surgery is around the clock pain control with Tylenol (acetaminophen) and Motrin (ibuprofen or Advil). Alternating these medications with each other allows you to maximize your pain control. In addition to Tylenol and Motrin, you can use heating pads or ice packs on your incisions to help reduce your pain.  How will I alternate your regular strength over-the-counter pain medication? You will take a dose of pain medication every three hours. ; Start by taking 650 mg of Tylenol (2 pills of 325  mg) ; 3 hours later take 600 mg of Motrin (3 pills of 200 mg) ; 3 hours after taking the Motrin take 650 mg of Tylenol ; 3 hours after that take 600 mg of Motrin.   - 1 -  See example - if your first dose of Tylenol is at 12:00 PM   12:00 PM Tylenol 650 mg (2 pills of 325 mg)  3:00 PM Motrin 600 mg (3 pills of 200 mg)  6:00 PM Tylenol 650 mg (2 pills of 325 mg)  9:00 PM Motrin 600 mg (3 pills of 200 mg)  Continue alternating every 3 hours   We recommend that you follow this schedule around-the-clock for at least 3 days after surgery, or until you feel that it is no longer needed. Use the table on the last page of this handout to keep track of the medications you are taking. Important: Do not take more than 3000mg of Tylenol or 3200mg of Motrin in a 24-hour period. Do not take ibuprofen/Motrin if you have a history of bleeding stomach ulcers, severe kidney disease, &/or actively taking a blood thinner  What if I still have pain? If you have pain that is not   controlled with the over-the-counter pain medications (Tylenol and Motrin or Advil) you might have what we call "breakthrough" pain. You will receive a prescription for a small amount of an opioid pain medication such as Oxycodone, Tramadol, or Tylenol with Codeine. Use these opioid pills in the first 24 hours after surgery if you have breakthrough pain. Do not take more than 1 pill every 4-6 hours.  If you still have uncontrolled pain after using all opioid pills, don't hesitate to call our staff using the number provided. We will help make sure you are managing your pain in the best way possible, and if necessary, we can provide a prescription for additional pain medication.   Day 1    Time  Name of Medication Number of pills taken  Amount of Acetaminophen  Pain Level   Comments  AM PM       AM PM       AM PM       AM PM       AM PM       AM PM       AM PM       AM PM       Total Daily amount of Acetaminophen Do not  take more than  3,000 mg per day      Day 2    Time  Name of Medication Number of pills taken  Amount of Acetaminophen  Pain Level   Comments  AM PM       AM PM       AM PM       AM PM       AM PM       AM PM       AM PM       AM PM       Total Daily amount of Acetaminophen Do not take more than  3,000 mg per day      Day 3    Time  Name of Medication Number of pills taken  Amount of Acetaminophen  Pain Level   Comments  AM PM       AM PM       AM PM       AM PM          AM PM       AM PM       AM PM       AM PM       Total Daily amount of Acetaminophen Do not take more than  3,000 mg per day      Day 4    Time  Name of Medication Number of pills taken  Amount of Acetaminophen  Pain Level   Comments  AM PM       AM PM       AM PM       AM PM       AM PM       AM PM       AM PM       AM PM       Total Daily amount of Acetaminophen Do not take more than  3,000 mg per day      Day 5    Time  Name of Medication Number of pills taken  Amount of Acetaminophen  Pain Level   Comments  AM PM       AM PM       AM   PM       AM PM       AM PM       AM PM       AM PM       AM PM       Total Daily amount of Acetaminophen Do not take more than  3,000 mg per day       Day 6    Time  Name of Medication Number of pills taken  Amount of Acetaminophen  Pain Level  Comments  AM PM       AM PM       AM PM       AM PM       AM PM       AM PM       AM PM       AM PM       Total Daily amount of Acetaminophen Do not take more than  3,000 mg per day      Day 7    Time  Name of Medication Number of pills taken  Amount of Acetaminophen  Pain Level   Comments  AM PM       AM PM       AM PM       AM PM       AM PM       AM PM       AM PM       AM PM       Total Daily amount of Acetaminophen Do not take more than  3,000 mg per day        For additional information about how and where to safely dispose of unused  opioid medications - https://www.morepowerfulnc.org  Disclaimer: This document contains information and/or instructional materials adapted from [facility] for the typical patient with your condition. It does not replace medical advice from your health care provider because your experience may differ from that of the typical patient. Talk to your health care provider if you have any questions about this document, your condition or your treatment plan. Adapted from [facility]   

## 2020-04-14 NOTE — Op Note (Signed)
04/13/2020 - 04/14/2020 4:17 PM  PATIENT: Sydney Macias  35 y.o. female  Patient Care Team: System, Provider Not In as PCP - General  PRE-OPERATIVE DIAGNOSIS: Acute cholecystitis  POST-OPERATIVE DIAGNOSIS: Acute cholecystitis with empyema gallbladder  PROCEDURE: Laparoscopic cholecystectomy  SURGEON: Stephanie Coup. Cliffton Asters, MD  ASSISTANT: Barnetta Chapel PA-C  ANESTHESIA: General endotracheal  EBL: No intake/output data recorded.  DRAINS: None  SPECIMEN: Gallbladder  COUNTS: Sponge, needle and instrument counts were reported correct x2 at the conclusion of the operation  DISPOSITION: PACU in satisfactory condition  COMPLICATIONS: None  FINDINGS: Acutely inflamed gallbladder with hydrops and additionally pus within lumen of gallbladder. Critical view of safety obtained prior to clipping or dividing any structures. Gallstone appreciated within specimen at conclusion.  DESCRIPTION:   The patient was identified & brought into the operating room. She was then positioned supine on the OR table. SCDs were in place and active during the entire case. She then underwent general endotracheal anesthesia. Pressure points were padded. Hair on the abdomen was clipped by the OR team. The abdomen was prepped and draped in the standard sterile fashion. Antibiotics were administered. A surgical timeout was performed and confirmed our plan.   A supraumbilical incision was made. The umbilical stalk was grasped and retracted outwardly. The supraumbilical fascia was identified and incised. The peritoneal cavity was gently entered bluntly. A purse-string 0 Vicryl suture was placed. The Hasson cannula was inserted into the peritoneal cavity and insufflation with CO2 commenced to . A laparoscope was inserted into the peritoneal cavity and inspection confirmed no evidence of trocar site complications. The patient was then positioned in reverse Trendelenburg with slight left side down. 3 additional 59mm  trocars were placed along the right subcostal line - one 21mm port in mid subcostal region, another 13mm port in the right flank near the anterior axillary line, and a third 79mm port in the left subxiphoid region obliquely near the falciform ligament. Of note, falciform is quite large and extends down to the umbilicus  The liver and gallbladder were inspected. Omentum covered the gallbladder. The liver is otherwise normal in appearance aside from some fatty changes. Omentum was then swept down off the gallbladder allow this tense structure to be revealed. This was too tense to grasp. A Nezhat was used to aspirate the gallbladder and hydrops + pus was obtained. The gallbladder fundus was grasped and elevated cephalad. An additional grasper was then placed on the infundibulum of the gallbladder and the infundibulum was retracted laterally. Staying high on the gallbladder, the peritoneum on both sides of the gallbladder was opened with hook cautery. Gentle blunt dissection was then employed with a [place] and laparoscopic kittners working down into [employer] which had numerous crossing tiny vessels consistent with prior cholecystitis/protracted symptoms. These were able to be exposed and then divided with hook cautery. The cystic duct was identified and carefully circumferentially dissected. The cystic artery was also identified and carefully circumferentially dissected. The space between the cystic artery and hepatocystic plate was developed such that a good view of the liver could be seen through a window medial to the cystic artery. The triangle of Calot had been cleared of all fibrofatty tissue. At this point, a critical view of safety was achieved and the only structures visualized was the skeletonized cystic duct laterally, the skeletonized cystic artery and the liver through the window medial to the artery. No posterior cystic artery was noted  A cholangiogram was not performed at this point as the  cystic  duct was relatively diminutive and fragile; potential for tearing/losing control of this structure during attempt passage of cholangiocatheter was significant.  The cystic duct and artery were clipped with 2 clips on the patient side and 1 clip on the specimen side. The cystic duct and artery were then divided. The gallbladder was then freed from its remaining attachments to the liver using electrocautery and placed into an endocatch bag. The RUQ was gently irrigated with sterile saline. Oozing on the gallbladder bed was then obtained with electrocautery. Hemostasis was then verified. The clips were in good position; the gallbladder fossa was dry. Surgicel snow was placed in the gallbladder fossa. The rest of the abdomen was inspected no injury nor bleeding elsewhere was identified.  The endocatch bag containing the gallbladder was then removed from the umbilical port site and passed off as specimen. A stone is palpable in the specimen. The RUQ ports were removed under direct visualization and noted to be hemostatic. The umbilical fascia was then closed using the 0 Vicryl purse-string suture. The fascia was palpated and noted to be completely closed. The skin of all incision sites was approximated with 4-0 monocryl subcuticular suture and dermabond applied. She was then awakened from anesthesia, extubated, and transferred to a stretcher for transport to PACU in satisfactory condition.

## 2020-04-14 NOTE — Anesthesia Postprocedure Evaluation (Signed)
Anesthesia Post Note  Patient: Sydney Macias  Procedure(s) Performed: LAPAROSCOPIC CHOLECYSTECTOMY (N/A )     Patient location during evaluation: PACU Anesthesia Type: General Level of consciousness: awake and alert Pain management: pain level controlled Vital Signs Assessment: post-procedure vital signs reviewed and stable Respiratory status: spontaneous breathing, nonlabored ventilation and respiratory function stable Cardiovascular status: blood pressure returned to baseline and stable Postop Assessment: no apparent nausea or vomiting Anesthetic complications: no   No complications documented.  Last Vitals:  Vitals:   04/14/20 1745 04/14/20 1811  BP: 123/87 138/84  Pulse: 88 91  Resp: 15 18  Temp: 37.3 C 36.8 C  SpO2: 93% 98%    Last Pain:  Vitals:   04/14/20 1811  TempSrc: Oral  PainSc:                  Lowella Curb

## 2020-04-14 NOTE — Anesthesia Procedure Notes (Signed)
Procedure Name: Intubation Date/Time: 04/14/2020 3:15 PM Performed by: Lavina Hamman, CRNA Pre-anesthesia Checklist: Patient identified, Emergency Drugs available, Suction available, Patient being monitored and Timeout performed Patient Re-evaluated:Patient Re-evaluated prior to induction Oxygen Delivery Method: Circle system utilized Preoxygenation: Pre-oxygenation with 100% oxygen Induction Type: IV induction, Rapid sequence and Cricoid Pressure applied Laryngoscope Size: Mac and 3 Grade View: Grade I Tube type: Oral Tube size: 7.0 mm Number of attempts: 1 Airway Equipment and Method: Stylet Placement Confirmation: ETT inserted through vocal cords under direct vision,  positive ETCO2,  CO2 detector and breath sounds checked- equal and bilateral Secured at: 21 cm Tube secured with: Tape Dental Injury: Teeth and Oropharynx as per pre-operative assessment  Comments: ATOI

## 2020-04-14 NOTE — Anesthesia Preprocedure Evaluation (Addendum)
Anesthesia Evaluation  Patient identified by MRN, date of birth, ID band  Reviewed: Allergy & Precautions, NPO status , Patient's Chart, lab work & pertinent test results  Airway Mallampati: II  TM Distance: >3 FB Neck ROM: Full    Dental  (+) Dental Advisory Given, Teeth Intact   Pulmonary neg pulmonary ROS,    Pulmonary exam normal breath sounds clear to auscultation       Cardiovascular negative cardio ROS Normal cardiovascular exam Rhythm:Regular Rate:Normal     Neuro/Psych negative neurological ROS     GI/Hepatic negative GI ROS, Neg liver ROS,   Endo/Other  negative endocrine ROS  Renal/GU negative Renal ROS     Musculoskeletal negative musculoskeletal ROS (+)   Abdominal   Peds  Hematology negative hematology ROS (+)   Anesthesia Other Findings   Reproductive/Obstetrics                            Anesthesia Physical Anesthesia Plan  ASA: II  Anesthesia Plan: General   Post-op Pain Management:    Induction: Intravenous, Rapid sequence and Cricoid pressure planned  PONV Risk Score and Plan: 4 or greater and Ondansetron, Dexamethasone, Propofol infusion, Midazolam, Treatment may vary due to age or medical condition and Diphenhydramine  Airway Management Planned: Oral ETT  Additional Equipment:   Intra-op Plan:   Post-operative Plan: Extubation in OR  Informed Consent: I have reviewed the patients History and Physical, chart, labs and discussed the procedure including the risks, benefits and alternatives for the proposed anesthesia with the patient or authorized representative who has indicated his/her understanding and acceptance.     Dental advisory given  Plan Discussed with: CRNA  Anesthesia Plan Comments:        Anesthesia Quick Evaluation

## 2020-04-14 NOTE — Progress Notes (Signed)
Appreciate surgical care. Discussed w/ Barnetta Chapel, agree with transfer to surgical service--much appreicated. No acute/chronic medical issues. Will sign off but I am here tomorrow please call me if I can be of help.  Brendia Sacks, MD [facility]

## 2020-04-14 NOTE — Progress Notes (Signed)
PROGRESS NOTE  Sydney Macias NAT:[id number] DOB: February 01, 1986 DOA: 04/13/2020 PCP: System, Provider Not In  Brief History   35 year old woman no significant PMH presented with acute on chronic abdominal pain, admitted for further evaluation with differential including peptic ulcer disease and biliary colic/cholecystitis.  CT of the abdomen pelvis and right upper quadrant ultrasound unremarkable.  LFTs and lipase within normal limits.  Treated with empiric antibiotics.  Seen by general surgery and GI.  HIDA scan compatible with cystic duct obstruction.  Continue empiric antibiotics, follow-up surgical recommendations.  A & P  Severe epigastric and right upper quadrant pain in the context of Naprosyn use, made worse by eating.  LFTs and abdominal imaging unremarkable --HIDA scan compatible with cystic duct obstruction consistent with cholecystitis, now suspected.  Continue empiric antibiotics.  Follow-up surgery recommendations. --Continue pain control.  Remain n.p.o. for now.   Disposition Plan:  Discussion:    Dispo: The patient is from: Home              Anticipated d/c is to: Home              Anticipated d/c date is: 2 days              Patient currently is not medically stable to d/c.   Difficult to place patient No DVT prophylaxis: SCDs Start: 04/13/20 2049   Code Status: Full Code Level of care: Med-Surg Family Communication: none  Brendia Sacks, MD  [facility] Direct contact: see [email or web address] (further directions at bottom of note if needed) 7PM-7AM contact night coverage as at bottom of note 04/14/2020, 1:54 PM  LOS: 0 days   Significant Hospital Events   .    Consults:  . GI . General surgery   Procedures:  .   Significant Diagnostic Tests:  Marland Kitchen    Micro Data:  .    Antimicrobials:  .   Interval History/Subjective  CC: f/u abd pain  Continues to have abd pain, no change.  Objective   Vitals:  Vitals:   04/14/20 0909 04/14/20 1343  BP: (!)  135/93 (!) 142/93  Pulse: 80 89  Resp:    Temp: 98.8 F (37.1 C) 98.7 F (37.1 C)  SpO2: 97% 100%    Exam:  Constitutional:   . Appears calm, uncomfortable, nontoxic ENMT:  . grossly normal hearing  Respiratory:  . CTA bilaterally, no w/r/r.  . Respiratory effort normal.  Cardiovascular:  . RRR, no m/r/g . No LE extremity edema   Abd: soft nd Psychiatric:  . Mental status o Mood, affect appropriate  I have personally reviewed the following:   Today's Data  . CMP and lipase unremarkable.  CBC unremarkable. Marland Kitchen HIDA scan shows cystic duct obstruction compatible with cholecystitis  Scheduled Meds: .  morphine injection  3 mg Intravenous Once  . pantoprazole (PROTONIX) IV  40 mg Intravenous Q12H  . sodium chloride flush  3 mL Intravenous Q12H   Continuous Infusions: . sodium chloride 150 mL/hr at 04/14/20 0505  . cefTRIAXone (ROCEPHIN)  IV 2 g (04/14/20 4270)    Principal Problem:   Epigastric abdominal pain   LOS: 0 days   How to contact the [facility] Attending or Consulting provider 7A - 7P or covering provider during after hours 7P -7A, for this patient?  1. Check the care team in [facility] and look for a) attending/consulting [facility] provider listed and b) the [facility] team listed 2. Log into [email or web address] and use [place]'s universal password to access. If  you do not have the password, please contact the hospital operator. 3. Locate the [facility] provider you are looking for under [facility] and page to a number that you can be directly reached. 4. If you still have difficulty reaching the provider, please page the [facility] (Director on Call) for the Hospitalists listed on amion for assistance.

## 2020-04-15 ENCOUNTER — Encounter (HOSPITAL_COMMUNITY): Payer: Self-pay | Admitting: Surgery

## 2020-04-15 MED ORDER — ACETAMINOPHEN 500 MG PO TABS: 1000.0000 mg | ORAL_TABLET | Freq: Four times a day (QID) | ORAL | 0 refills | Status: AC | PRN

## 2020-04-15 MED ORDER — IBUPROFEN 600 MG PO TABS: 600.0000 mg | ORAL_TABLET | Freq: Three times a day (TID) | ORAL | 0 refills | Status: AC | PRN

## 2020-04-15 MED ORDER — HYDROMORPHONE HCL 1 MG/ML IJ SOLN
0.5000 mg | INTRAMUSCULAR | Status: DC | PRN
Start: 2020-04-15 — End: 2020-04-15

## 2020-04-15 MED ORDER — OXYCODONE HCL 5 MG PO TABS
5.0000 mg | ORAL_TABLET | ORAL | Status: DC | PRN
  Administered 2020-04-15 (×2): 5 mg via ORAL
  Filled 2020-04-15 (×2): qty 1

## 2020-04-15 MED ORDER — IBUPROFEN 400 MG PO TABS
600.0000 mg | ORAL_TABLET | Freq: Three times a day (TID) | ORAL | Status: DC
  Administered 2020-04-15: 600 mg via ORAL
  Filled 2020-04-15: qty 1

## 2020-04-15 MED ORDER — PANTOPRAZOLE SODIUM 40 MG PO TBEC
40.0000 mg | DELAYED_RELEASE_TABLET | Freq: Every day | ORAL | Status: DC
  Administered 2020-04-15: 10:00:00 40 mg via ORAL
  Filled 2020-04-15: qty 1

## 2020-04-15 MED ORDER — POLYETHYLENE GLYCOL 3350 17 G PO PACK
17.0000 g | PACK | Freq: Every day | ORAL | Status: DC
  Administered 2020-04-15: 10:00:00 17 g via ORAL
  Filled 2020-04-15: qty 1

## 2020-04-15 MED ORDER — ACETAMINOPHEN 500 MG PO TABS
1000.0000 mg | ORAL_TABLET | Freq: Four times a day (QID) | ORAL | Status: DC
  Administered 2020-04-15: 10:00:00 1000 mg via ORAL
  Filled 2020-04-15: qty 2

## 2020-04-15 MED ORDER — OXYCODONE HCL 5 MG PO TABS: 5.0000 mg | ORAL_TABLET | ORAL | 0 refills | Status: AC | PRN
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
## 2020-04-15 NOTE — Progress Notes (Signed)
Pt alert and oriented. Tolerating diet. D/C orders given, pt d/cd home.

## 2020-04-15 NOTE — Progress Notes (Signed)
[facility] Progress Note  Sydney Macias 35 y.o. 1986/02/28  CC: Abdominal pain, nausea and vomiting   Subjective: Patient seen and examined at bedside.  Feeling better since cholecystectomy.  Denies any further nausea or vomiting.  No bowel movement since admission.  ROS : Negative for chest pain and shortness of breath   Objective: Vital signs in last 24 hours: Vitals:   04/15/20 0520 04/15/20 0928  BP: 119/71 123/79  Pulse: 73 81  Resp: 18 18  Temp: 98.5 F (36.9 C) 97.7 F (36.5 C)  SpO2: 97% 99%    Physical Exam:  General:  Alert, cooperative, no distress, appears stated age  Head:  Normocephalic, without obvious abnormality, atraumatic  Eyes:  , EOM's intact,   Lungs:   Clear to auscultation bilaterally, respirations unlabored  Heart:  Regular rate and rhythm, S1, S2 normal  Abdomen:    Mild generalized abdominal soreness, abdomen is soft, nontender, bowel sounds present.  No peritoneal signs  Extremities: Extremities normal, atraumatic, no  edema  Pulses: 2+ and symmetric    Lab Results: Recent Labs    04/13/20 0836 04/14/20 0438  NA 133* 136  K 4.1 3.8  CL 100 105  CO2 23 20*  GLUCOSE 104* 102*  BUN 10 8  CREATININE 0.77 0.59  CALCIUM 9.3 8.2*   Recent Labs    04/13/20 0836 04/14/20 0438  AST 25 17  ALT 30 24  ALKPHOS 52 49  BILITOT 0.7 1.0  PROT 8.7* 7.0  ALBUMIN 4.7 3.7   Recent Labs    04/13/20 0836 04/14/20 0438  WBC 12.0* 11.3*  HGB 13.9 12.6  HCT 42.3 39.1  MCV 88.5 89.5  PLT 314 223   No results for input(s): LABPROT, INR in the last 72 hours.    Assessment/Plan: -Acute cholecystitis.  Status post laparoscopic cholecystectomy yesterday.  Feeling better today. -Constipation. -History of NSAID use  Recommendations ------------------------- -Start MiraLAX -Continue Protonix 40 mg once a day for 4 weeks because of recent significant NSAID use -Further management from surgical standpoint.  GI will sign off.   Call us back if needed   Kathi Der MD, FACP 04/15/2020, 9:38 AM  Contact #  [phone]

## 2020-04-16 LAB — SURGICAL PATHOLOGY

## 2022-09-26 IMAGING — NM NM HEPATOBILIARY IMAGE, INC GB
3 series · 18 of 18 positions shown · non-contrast
Comparison: CT evaluation from April 13, 2020

CLINICAL DATA: Upper abdominal pain, with some chronicity in a
34-year-old female.

EXAM:
NUCLEAR MEDICINE HEPATOBILIARY IMAGING
TECHNIQUE: Sequential images of the abdomen were obtained [DATE] minutes
following intravenous administration of radiopharmaceutical.
RADIOPHARMACEUTICALS:  4.8 millicuries mCi Xc-PPm  Choletec IV

[Series 1: hida w morphine · 3.28mm/px · 6 of 30 frames shown]
[frame 3/30]
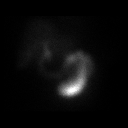
[frame 8/30]
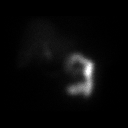
[frame 13/30]
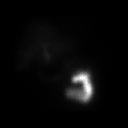
[frame 18/30]
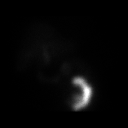
[frame 23/30]
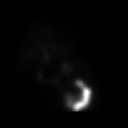
[frame 28/30]
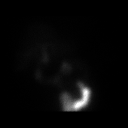

[Series 1: hida scan hr1 · 3.28mm/px · 6 of 40 frames shown]
[frame 4/40]
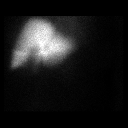
[frame 10/40]
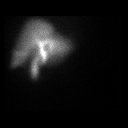
[frame 17/40]
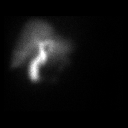
[frame 24/40]
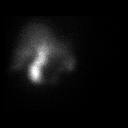
[frame 30/40]
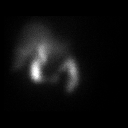
[frame 37/40]
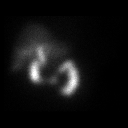

[Series 1: hida scan 15 min · 3.28mm/px · 6 of 15 frames shown]
[frame 2/15]
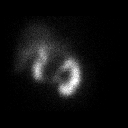
[frame 4/15]
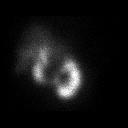
[frame 7/15]
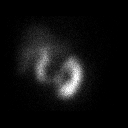
[frame 9/15]
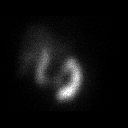
[frame 12/15]
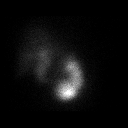
[frame 14/15]
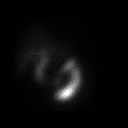

[18 of 18 positions shown; findings below may reference images not displayed]

FINDINGS: Prompt uptake and excretion of radiotracer by the liver into the
biliary tree. Further excretion into the small bowel. After 1 hour
no visualization of the gallbladder. 3 mg morphine administered.
Subsequent images display non filling of the gallbladder compatible
with cystic duct obstruction.
IMPRESSION: Cystic duct obstruction such as can be seen in the setting of acute
cholecystitis, compatible with cholecystitis when correlated with CT
imaging.

Patent common bile duct.
